# Patient Record
Sex: Female | Born: 1955 | Race: White | Hispanic: No | Marital: Married | Smoking: Never smoker
Health system: Southern US, Community
[De-identification: ages and names within clinical notes are randomized; demographics above are authoritative.]

## PROBLEM LIST (undated history)

## (undated) DIAGNOSIS — R011 Cardiac murmur, unspecified: Secondary | ICD-10-CM

## (undated) DIAGNOSIS — E785 Hyperlipidemia, unspecified: Secondary | ICD-10-CM

## (undated) DIAGNOSIS — T7840XA Allergy, unspecified, initial encounter: Secondary | ICD-10-CM

## (undated) DIAGNOSIS — M8580 Other specified disorders of bone density and structure, unspecified site: Secondary | ICD-10-CM

## (undated) DIAGNOSIS — T884XXA Failed or difficult intubation, initial encounter: Secondary | ICD-10-CM

## (undated) DIAGNOSIS — I10 Essential (primary) hypertension: Secondary | ICD-10-CM

## (undated) DIAGNOSIS — G709 Myoneural disorder, unspecified: Secondary | ICD-10-CM

## (undated) DIAGNOSIS — G4730 Sleep apnea, unspecified: Secondary | ICD-10-CM

## (undated) HISTORY — DX: Myoneural disorder, unspecified: G70.9

## (undated) HISTORY — DX: Hyperlipidemia, unspecified: E78.5

## (undated) HISTORY — DX: Essential (primary) hypertension: I10

## (undated) HISTORY — DX: Sleep apnea, unspecified: G47.30

## (undated) HISTORY — DX: Failed or difficult intubation, initial encounter: T88.4XXA

## (undated) HISTORY — DX: Allergy, unspecified, initial encounter: T78.40XA

## (undated) HISTORY — DX: Cardiac murmur, unspecified: R01.1

## (undated) HISTORY — DX: Other specified disorders of bone density and structure, unspecified site: M85.80

## (undated) HISTORY — PX: POLYPECTOMY: SHX149

## (undated) HISTORY — PX: VAGINAL HYSTERECTOMY: SUR661

## (undated) HISTORY — PX: COLONOSCOPY: SHX174

## (undated) HISTORY — PX: MYOMECTOMY: SHX85

---

## 1997-08-30 ENCOUNTER — Other Ambulatory Visit: Admission: RE | Admit: 1997-08-30 | Discharge: 1997-08-30 | Payer: Self-pay | Admitting: Obstetrics & Gynecology

## 1998-09-06 ENCOUNTER — Other Ambulatory Visit: Admission: RE | Admit: 1998-09-06 | Discharge: 1998-09-06 | Payer: Self-pay | Admitting: Obstetrics & Gynecology

## 1999-09-24 ENCOUNTER — Other Ambulatory Visit: Admission: RE | Admit: 1999-09-24 | Discharge: 1999-09-24 | Payer: Self-pay | Admitting: Obstetrics & Gynecology

## 2000-11-09 ENCOUNTER — Other Ambulatory Visit: Admission: RE | Admit: 2000-11-09 | Discharge: 2000-11-09 | Payer: Self-pay | Admitting: Obstetrics & Gynecology

## 2001-12-30 ENCOUNTER — Other Ambulatory Visit: Admission: RE | Admit: 2001-12-30 | Discharge: 2001-12-30 | Payer: Self-pay | Admitting: Obstetrics & Gynecology

## 2003-02-07 ENCOUNTER — Other Ambulatory Visit: Admission: RE | Admit: 2003-02-07 | Discharge: 2003-02-07 | Payer: Self-pay | Admitting: Obstetrics & Gynecology

## 2004-02-20 ENCOUNTER — Other Ambulatory Visit: Admission: RE | Admit: 2004-02-20 | Discharge: 2004-02-20 | Payer: Self-pay | Admitting: Obstetrics & Gynecology

## 2005-04-22 ENCOUNTER — Other Ambulatory Visit: Admission: RE | Admit: 2005-04-22 | Discharge: 2005-04-22 | Payer: Self-pay | Admitting: Obstetrics & Gynecology

## 2005-10-15 ENCOUNTER — Ambulatory Visit: Payer: Self-pay | Admitting: Internal Medicine

## 2005-10-28 ENCOUNTER — Encounter: Payer: Self-pay | Admitting: Internal Medicine

## 2005-10-28 ENCOUNTER — Ambulatory Visit: Payer: Self-pay | Admitting: Internal Medicine

## 2006-06-19 ENCOUNTER — Encounter: Admission: RE | Admit: 2006-06-19 | Discharge: 2006-06-19 | Payer: Self-pay | Admitting: Obstetrics & Gynecology

## 2006-11-04 ENCOUNTER — Ambulatory Visit (HOSPITAL_COMMUNITY): Admission: RE | Admit: 2006-11-04 | Discharge: 2006-11-05 | Payer: Self-pay | Admitting: Obstetrics & Gynecology

## 2006-11-04 ENCOUNTER — Encounter (INDEPENDENT_AMBULATORY_CARE_PROVIDER_SITE_OTHER): Payer: Self-pay | Admitting: Obstetrics & Gynecology

## 2009-08-27 ENCOUNTER — Encounter: Admission: RE | Admit: 2009-08-27 | Discharge: 2009-08-27 | Payer: Self-pay | Admitting: Obstetrics & Gynecology

## 2010-05-28 NOTE — Op Note (Signed)
NAMECORALYN, Tonya Pineda              ACCOUNT NO.:  [id number]   MEDICAL RECORD NO.:  [id number]          PATIENT TYPE:  OIB   LOCATION:  9318                          FACILITY:  [facility]   PHYSICIAN:  Freddy Finner, M.D.   DATE OF BIRTH:  1955/07/11   DATE OF PROCEDURE:  11/04/2006  DATE OF DISCHARGE:                               OPERATIVE REPORT   PREOPERATIVE DIAGNOSES:  1. Large uterine leiomyomata.  2. 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   POSTOPERATIVE DIAGNOSES:  1. Large uterine leiomyomata.  2. 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   __________  Laparoscopically-assisted vaginal hysterectomy and bilateral salpingo-  oophorectomy.   SURGEON:  Jennette Kettle.   ASSISTANTRana Snare.   ESTIMATED INTRAOPERATIVE BLOOD LOSS:  200 mL.   ANESTHESIA:  General endotracheal.   INTRAOPERATIVE COMPLICATIONS:  Extraordinarily difficult intubation  requiring 45 minutes with competent anesthesia management which was  initially accomplished.  There were no other intraoperative  complications.   Patient was admitted on the morning for surgery.  She was given an IV  bolus of Ancef.  Preoperatively, she was placed in TEDs hose.  She was  brought to the operating room, placed under adequate general  endotracheal anesthesia, placed in the dorsal lithotomy position using  the [place] stirrup system.  Betadine prep of abdomen, perineum, and  vagina was accomplished using Betadine scrub followed by Betadine  solution.  Foley catheter was used to evacuate the bladder with sterile  technique.  Hulka tenaculum was attached to the cervix without  difficulty.  Sterile drapes were applied.  Two small incisions were made  in the abdomen, one at the umbilicus and one just above the symphysis  through an old lower abdominal transverse scar.  Through the upper  incision, an 11-mm blade and disposable trocar were introduced while  elevating the abdominal wall manually.  Direct inspection  revealed  adequate placement with no evidence of injury on entry.  Pneumoperitoneum was allowed to accumulate with carbon dioxide gas.  A  second 5 mm trocar was placed through the lower incision under direct  visualization.  Through this spring-loaded grasping forceps was used.  Systematic examination of pelvic contents revealed the uterus to be  irregularly enlarged and approximately 10 to 12 weeks' size.  The tubes  and ovaries were normal and appropriate for age.  There was no apparent  abnormality in the upper abdomen.  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  Using the gyrus tripolar device through the  operating tunnel of the laparoscope, the right infundibulopelvic  ligament, right round ligament, upper broad ligament, each was  progressively sealed and divided.  The dissection was carried down to  the level just above the uterine arteries.  The left side was then  treated essentially identically.  Careful examination of the latter did  reveal that it was not dramatically advanced off the cervix.  Attention  was then turned vaginally.  A posterior wider vaginal retractor was  placed.  The hook tenaculum was removed and the cervix grasped with a  Jacobs tenaculum.  Colpotomy incision was made  by __________  the mucosa  posterior to the cervix and entering with Mayo scissors.  Cervix was  circumscribed with a scalpel.  The uterosacral  pedicles were then  sealed and divided using the LigaSure system.  Bladder was advanced off  the cervix.  Bladder pillars were taken, sealed, and divided with  LigaSure.  The anterior dissection was progressively continued and the  bladder further advanced off the uterine cervix.  Cardinal ligament  pedicles were taken, sealed, and divided with LigaSure.  Further  advancement was accomplished and the vessel pedicles were tucked  inferior.  Digital expiration revealed that the dissection was into the  myometrium but the peritoneum could not be  palpated above the uterus and  anteriorly.  After adequately controlling the blood supply, the uterus  was reduced in volume with a coring procedure.  The remaining corpus of  the uterus was delivered through the introitus and remaining peritoneum  and scoring anteriorly was sealed and divided using LigaSure.  The  patient was given indigo carmine IV approximately 10 minutes before  concluding this portion of the procedure and no blue dye was noted to  leak from the dissected field.  The uterus cycles were then anchored to  the mucosa with a mattress suture of 0 Monocryl on each side.  The  posterior peritoneum was closed and the uterus cycle was plicated with  an interrupted 0 Monocryl suture.  Cuff was placed vertically with  figure-of-eight 0 Monocryl.  Foley catheter was placed and immediate  spillage of blue urine was noted.  Hemostasis at this level was  complete.  Reinspection abdominally was then carried out with the  laparoscope and then Nazat irrigating system.  Hemostasis was also  confirmed abdominally.  Photographs were made before and after the  hysterectomy and are retained in the office record.  The procedure was  terminated after aspirating all the irrigating solution from the  abdomen.  The instruments were removed.  The gas was allowed to escape  from the abdomen.  The skin incisions were anesthetized with 0.25% plain  Marcaine and closed with interrupted subcuticular sutures of 3-0 Dexon.  Steri-Strips were also applied to the lower incision.  A sterile  dressing was applied to the umbilicus.  The patient was awakened and  taken to the recovery in satisfactory condition.      Freddy Finner, M.D.  Electronically Signed     WRN/MEDQ  D:  11/04/2006  T:  11/05/2006  Job:  [id number]

## 2010-05-28 NOTE — H&P (Signed)
NAMELYNDEL, DANCEL              ACCOUNT NO.:  [id number]   MEDICAL RECORD NO.:  [id number]          PATIENT TYPE:  AMB   LOCATION:  SDC                           [facility]ILITY:  [facility]   PHYSICIAN:  Freddy Finner, M.D.   DATE OF BIRTH:  27-Sep-1955   DATE OF ADMISSION:  11/04/2006  DATE OF DISCHARGE:                              HISTORY & PHYSICAL   ADMITTING DIAGNOSIS:  Uterine leiomyomata.  Suspected recent  degeneration of myoma, creating significant pelvic cramping pain.  Progressive enlargement of myomas over time has been noted and followed  in the office.  The patient did have a myomectomy in 1986.  She had  given birth to one child vaginally before this myomectomy and had a  second child by cesarean section in 1988.  Over the interval since that  time, she has been managed with oral contraceptives for most of the  time.  She did present in September of this year at which time the  uterus was thought to be approximately [redacted] weeks gestational size.  Pelvic ultrasound revealed numerous leiomyomata, including one impinging  on the endometrial cavity.  This is creating chronic pelvic pain and  cramping.  She has requested definitive surgery at this time, given her  age of 34, and the large fibroids and pain associated with this, and she  is now admitted for laparoscopically-assisted vaginal hysterectomy,  bilateral salpingo-oophorectomy.  The patient has been counseled on the  possibility of a total abdominal hysterectomy, bilateral salpingo-  oophorectomy, given her previous history of myomectomy and the large  size of the uterus at the present time.  She has been counseled on the  potential risks of either procedure, including hemorrhage, infection,  injury to other organs.  She is now admitted and prepared to proceed  with surgery.   Her current review of systems is otherwise negative.  There are no  specific cardiopulmonary, GI or other GU complaints.   She did have an  appendectomy at the time of her myomectomy.  She has had  no other known surgical procedures.   She has no known allergies to medications.   She does have hypertension, for which she takes 25 mg of  hydrochlorothiazide a day and 10 mg of lisinopril per day and Diltiazem  360 mg a day.  She takes Xalatan eye drops.  She takes Nasonex for  seasonal allergies.   She has never had to have a blood transfusion.   She is not a cigarette-smoker.  She does not use alcohol.   FAMILY HISTORY:  Noncontributory.   PHYSICAL EXAMINATION:  HEENT:  Grossly within normal limits.  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  She is aware  of this from previous surgery.  Her blood pressure was 140/70 in the office two days prior to her  admission at her preoperative examination.  CHEST:  Clear to auscultation.  HEART:  Normal sinus rhythm.  There is a grade 2/6 early systolic  murmur, heard best at the left sternal border and the second intercostal  space.  There are no other  audible murmurs, no rubs or gallops.  BREAST EXAM:  Considered to be normal, no palpable masses, no skin  change or nipple discharge.  Recent mammogram was considered to be  normal.  ABDOMEN:  Soft and nontender.  The uterus is palpable above the  symphysis.  There is no other appreciable hepatosplenomegaly, there is  no CVA tenderness.  PELVIC EXAM:  External genitalia, vagina and cervix are normal to  inspection.  Bimanual reveals uterus to be anterior in position, [redacted]  weeks gestational size.  There are no palpable adnexal masses.  RECTOVAGINAL EXAM:  Confirms these findings.  There are no palpable  rectal abnormalities.  EXTREMITIES:  Without cyanosis, clubbing or edema.   ASSESSMENT:  Large uterine leiomyomata, now creating chronic pelvic  pain.   PLAN:  Total hysterectomy, bilateral salpingo-oophorectomy by either  laparoscopic or abdominal incision.      Freddy Finner, M.D.   Electronically Signed     WRN/MEDQ  D:  11/03/2006  T:  11/04/2006  Job:  [id number]

## 2010-05-31 NOTE — Discharge Summary (Signed)
Tonya Pineda, Tonya Pineda              ACCOUNT NO.:  [id number]   MEDICAL RECORD NO.:  [id number]          PATIENT TYPE:  OIB   LOCATION:  9318                          [facility]   PHYSICIAN:  Freddy Finner, M.D.   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  Numerous leiomyomata identified histologically and benign.  No  confirmation of degeneration of fibroid.   OPERATIVE PROCEDURE:  Laparoscopic-assisted vaginal hysterectomy,  bilateral salpingo-oophorectomy.   INTRAOPERATIVE AND POSTOPERATIVE COMPLICATIONS:  None.   DISPOSITION:  The patient was in satisfactory improved condition on the  day following her surgery.  She was ambulating without difficulty,  having adequate bowel and bladder function.  She remained afebrile  throughout her hospital stay.  She was discharged home with Percocet  5/325 to be taken as needed for postoperative pain.  She is to resume  all of her admission medications.   The details of the present illness, past history, family history, review  of systems and physical exam are recorded in the admission note.  Physical findings on admission were remarkable for enlargement of the  uterus.  Her clinical history was significant with chronic pain.   Laboratory data during this admission includes hemoglobin of 14.6 on  admission with a normal CBC.  Normal prothrombin time and PTT.  Chem-6  showed potassium a little low at 3.4.  Postoperative hemoglobin was 11.4   HOSPITAL COURSE:  The patient was admitted on the morning of surgery.  She was treated perioperatively with IV antibiotics and antiembolic  compression hose.  The above-described procedure was accomplished  without difficulty or intraoperative complications.  By the  morning of  the first postoperative day, her condition was considered to be good.  She remained afebrile throughout her hospital stay.  The patient was  discharged home with disposition as noted above.      Freddy Finner, M.D.  Electronically Signed     WRN/MEDQ  D:  12/09/2006  T:  12/09/2006  Job:  [id number]

## 2010-10-23 LAB — APTT: aPTT: 24

## 2010-10-23 LAB — BASIC METABOLIC PANEL
BUN: 7
Calcium: 9.8
Chloride: 95 — ABNORMAL LOW
Sodium: 135

## 2010-10-23 LAB — CBC
Hemoglobin: 11.4 — ABNORMAL LOW
MCHC: 35
MCV: 93.7
MCV: 94.8
RBC: 3.47 — ABNORMAL LOW
RDW: 12.4

## 2010-11-25 ENCOUNTER — Encounter: Payer: Self-pay | Admitting: Internal Medicine

## 2010-12-24 ENCOUNTER — Encounter: Payer: Self-pay | Admitting: Internal Medicine

## 2011-01-03 ENCOUNTER — Other Ambulatory Visit: Payer: Self-pay | Admitting: Internal Medicine

## 2011-02-04 ENCOUNTER — Ambulatory Visit (AMBULATORY_SURGERY_CENTER): Payer: 59

## 2011-02-04 VITALS — Ht 63.5 in | Wt 149.4 lb

## 2011-02-04 DIAGNOSIS — Z86010 Personal history of colonic polyps: Secondary | ICD-10-CM

## 2011-02-04 DIAGNOSIS — Z1211 Encounter for screening for malignant neoplasm of colon: Secondary | ICD-10-CM

## 2011-02-04 MED ORDER — PEG-KCL-NACL-NASULF-NA ASC-C 100 G PO SOLR: 1.0000 | Freq: Once | ORAL | Status: AC

## 2011-02-05 ENCOUNTER — Encounter: Payer: Self-pay | Admitting: Internal Medicine

## 2011-02-14 ENCOUNTER — Ambulatory Visit (AMBULATORY_SURGERY_CENTER): Payer: 59 | Admitting: Internal Medicine

## 2011-02-14 ENCOUNTER — Encounter: Payer: Self-pay | Admitting: Internal Medicine

## 2011-02-14 VITALS — BP 144/77 | HR 87 | Temp 97.4°F | Resp 20 | Ht 63.5 in | Wt 149.0 lb

## 2011-02-14 DIAGNOSIS — Z1211 Encounter for screening for malignant neoplasm of colon: Secondary | ICD-10-CM

## 2011-02-14 DIAGNOSIS — D128 Benign neoplasm of rectum: Secondary | ICD-10-CM

## 2011-02-14 DIAGNOSIS — D126 Benign neoplasm of colon, unspecified: Secondary | ICD-10-CM

## 2011-02-14 DIAGNOSIS — D129 Benign neoplasm of anus and anal canal: Secondary | ICD-10-CM

## 2011-02-14 DIAGNOSIS — Z86010 Personal history of colonic polyps: Secondary | ICD-10-CM

## 2011-02-14 MED ORDER — SODIUM CHLORIDE 0.9 % IV SOLN: 500.0000 mL | INTRAVENOUS | Status: DC

## 2011-02-14 NOTE — Op Note (Signed)
[facility] [address] [place], [place]  [address]  COLONOSCOPY PROCEDURE REPORT  PATIENT:  Tonya Pineda, Tonya Pineda  MR#:  [id number] BIRTHDATE:  29-Oct-1955, 55 yrs. old  GENDER:  female ENDOSCOPIST:  Hedwig Morton. Juanda Chance, MD REF. 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 Digital rectal exam was performed and revealed no rectal masses. The LB 180AL [id number] endoscope was introduced through the anus and advanced to the cecum, which was identified by both the appendix and ileocecal valve.  The quality of the prep was excellent, using MoviPrep.  The instrument was then slowly withdrawn as the colon was fully examined. <<PROCEDUREIMAGES>>  FINDINGS:  A diminutive polyp was found. 2 mm polyp at 20 cm The polyp was removed using cold biopsy forceps.  Scattered diverticula were found (see image8 and image7). scattered shallow diverticuli in the right colon  This was otherwise a normal examination of the colon (see image9, image6, image3, image5, and image2).   Retroflexed views in the rectum revealed no abnormalities.    The scope was then withdrawn from the patient and the procedure completed.  COMPLICATIONS:  None ENDOSCOPIC IMPRESSION: 1) Diminutive polyp 2) Diverticula, scattered 3) Otherwise normal examination RECOMMENDATIONS: 1) Await pathology results 2) High fiber diet.  REPEAT EXAM:  In 10 year(s) for.  ______________________________ Hedwig Morton. Juanda Chance, MD  CC:  n. eSIGNED:   Hedwig Morton. Tonya Pineda at 02/14/2011 08:33 AM  Tonya Pineda,  [id number]

## 2011-02-14 NOTE — Progress Notes (Signed)
Patient did not have preoperative order for IV antibiotic SSI prophylaxis. (G8918)  Patient did not experience any of the following events: a burn prior to discharge; a fall within the facility; wrong site/side/patient/procedure/implant event; or a hospital transfer or hospital admission upon discharge from the facility. (G8907)  

## 2011-02-14 NOTE — Patient Instructions (Signed)
See discharge instructions.   Sleep study pamphlet given.

## 2011-02-17 ENCOUNTER — Telehealth: Payer: Self-pay | Admitting: *Deleted

## 2011-02-17 NOTE — Telephone Encounter (Signed)
  Follow up Call-  Call back number 02/14/2011  Post procedure Call Back phone  # [phone]  Permission to leave phone message Yes     Patient questions:  Left message for patient to call us if necessary.

## 2011-02-18 ENCOUNTER — Encounter: Payer: Self-pay | Admitting: Internal Medicine

## 2013-08-22 ENCOUNTER — Encounter: Payer: Self-pay | Admitting: Internal Medicine

## 2013-09-09 ENCOUNTER — Other Ambulatory Visit: Payer: Self-pay | Admitting: Obstetrics & Gynecology

## 2013-09-09 DIAGNOSIS — R928 Other abnormal and inconclusive findings on diagnostic imaging of breast: Secondary | ICD-10-CM

## 2013-09-15 ENCOUNTER — Encounter (INDEPENDENT_AMBULATORY_CARE_PROVIDER_SITE_OTHER): Payer: Self-pay

## 2013-09-15 ENCOUNTER — Ambulatory Visit
Admission: RE | Admit: 2013-09-15 | Discharge: 2013-09-15 | Disposition: A | Discharge status: Home / Self Care | Payer: 59 | Source: Ambulatory Visit | Attending: Obstetrics & Gynecology | Admitting: Obstetrics & Gynecology

## 2013-09-15 DIAGNOSIS — R928 Other abnormal and inconclusive findings on diagnostic imaging of breast: Secondary | ICD-10-CM

## 2014-09-11 ENCOUNTER — Other Ambulatory Visit: Payer: Self-pay | Admitting: Obstetrics & Gynecology

## 2014-09-12 LAB — CYTOLOGY - PAP

## 2016-09-24 ENCOUNTER — Other Ambulatory Visit: Payer: Self-pay | Admitting: Obstetrics & Gynecology

## 2016-09-24 DIAGNOSIS — R928 Other abnormal and inconclusive findings on diagnostic imaging of breast: Secondary | ICD-10-CM

## 2016-09-29 ENCOUNTER — Ambulatory Visit
Admission: RE | Admit: 2016-09-29 | Discharge: 2016-09-29 | Disposition: A | Discharge status: Home / Self Care | Payer: 59 | Source: Ambulatory Visit | Attending: Obstetrics & Gynecology | Admitting: Obstetrics & Gynecology

## 2016-09-29 ENCOUNTER — Ambulatory Visit: Payer: Self-pay

## 2016-09-29 DIAGNOSIS — R928 Other abnormal and inconclusive findings on diagnostic imaging of breast: Secondary | ICD-10-CM

## 2018-02-27 IMAGING — MG 2D DIGITAL DIAGNOSTIC UNILATERAL RIGHT MAMMOGRAM WITH CAD AND AD
6 series · 6 of 14 positions shown · non-contrast
Comparison: Previous exam(s).

CLINICAL DATA: 61-year-old female recalled from screening mammogram
dated 09/22/2016 for possible right breast distortion.

EXAM:
2D DIGITAL DIAGNOSTIC UNILATERAL RIGHT MAMMOGRAM WITH CAD AND
ADJUNCT TOMO

[R MLO]
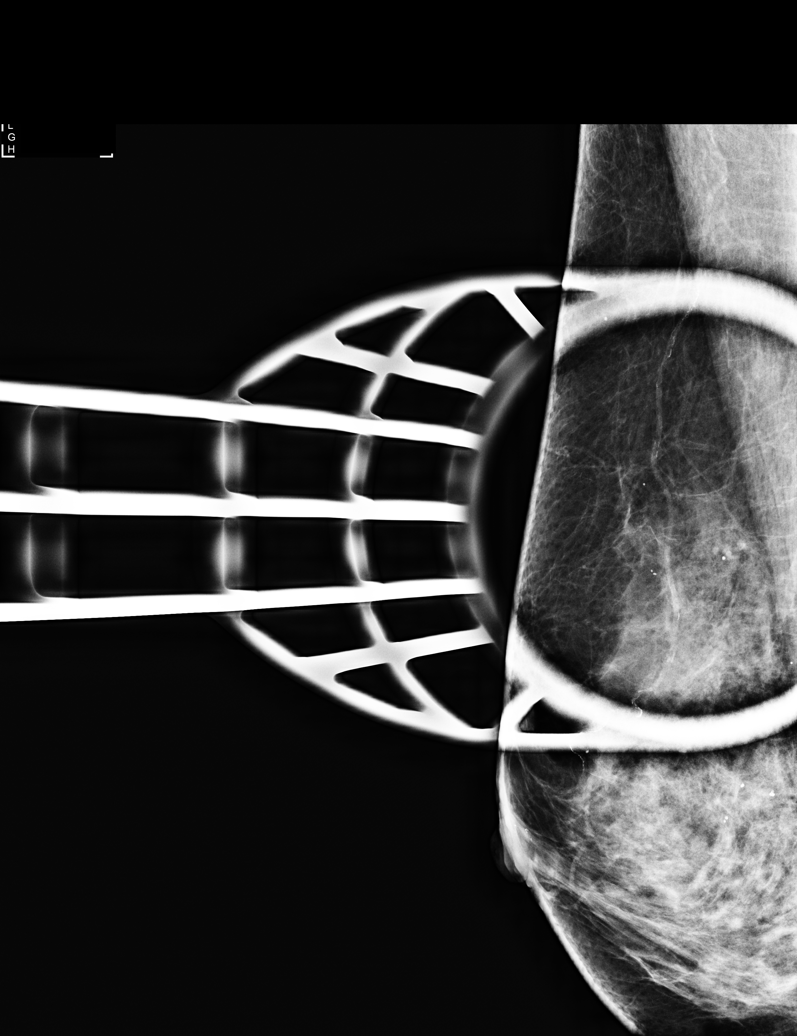

[R ML]
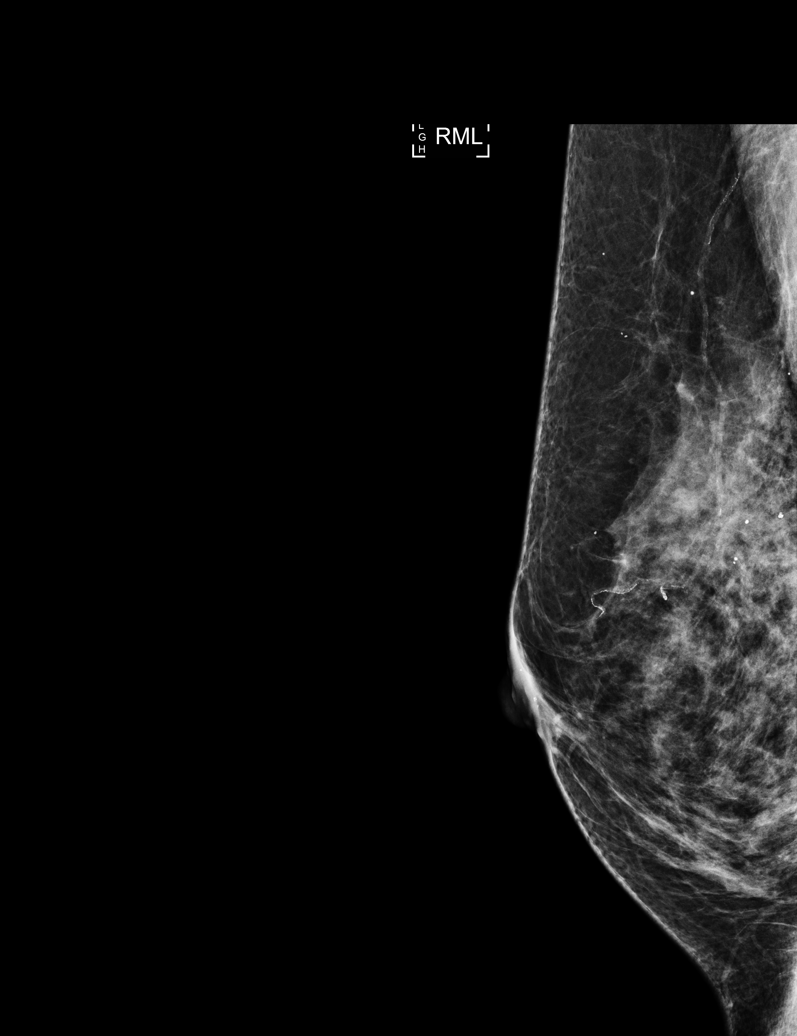

[R MLO synth-2D]
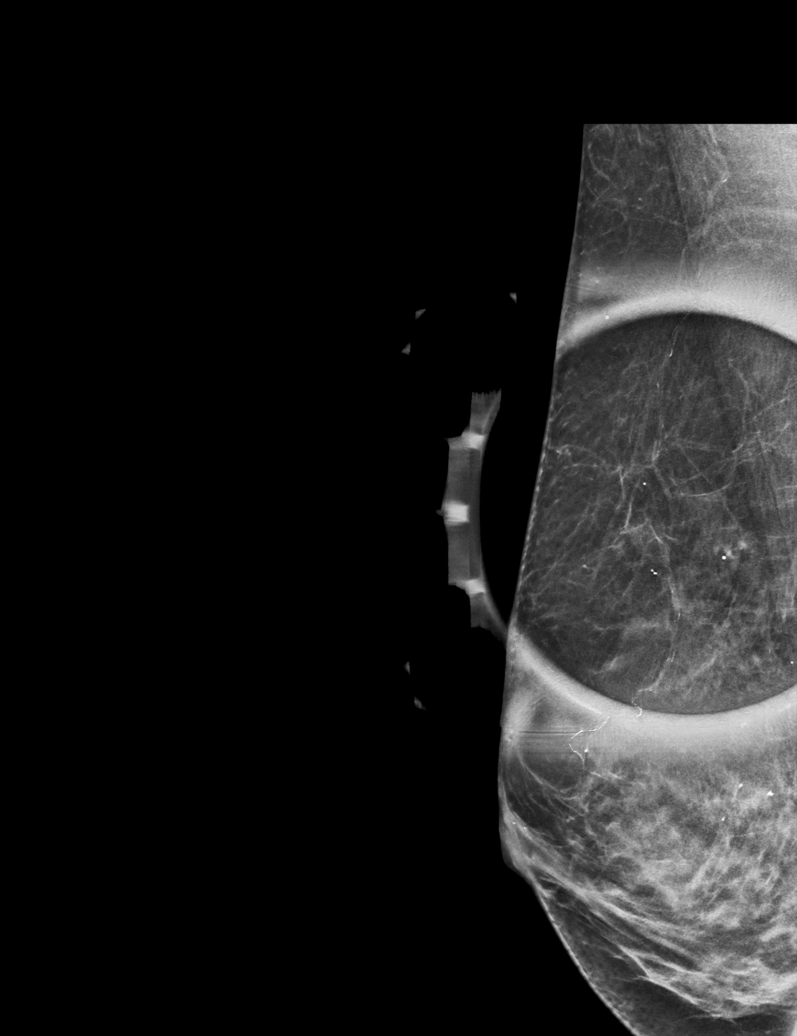

[R ML synth-2D]
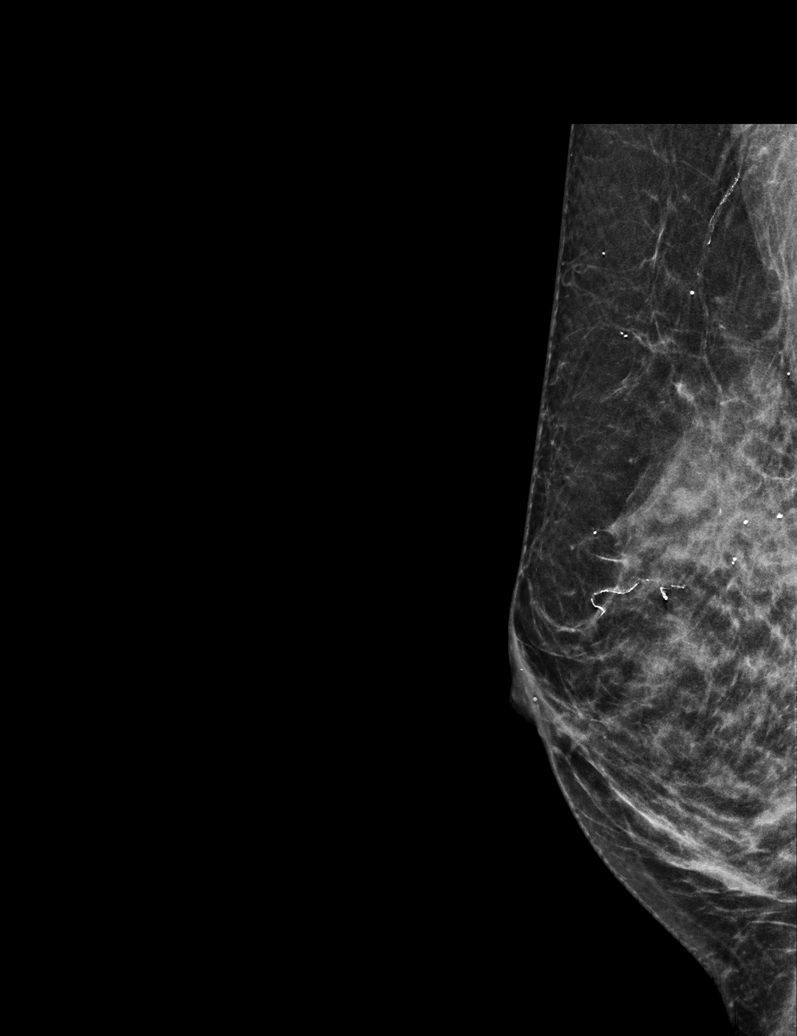

[R MLO tomo · tomo slice 26/51.0]
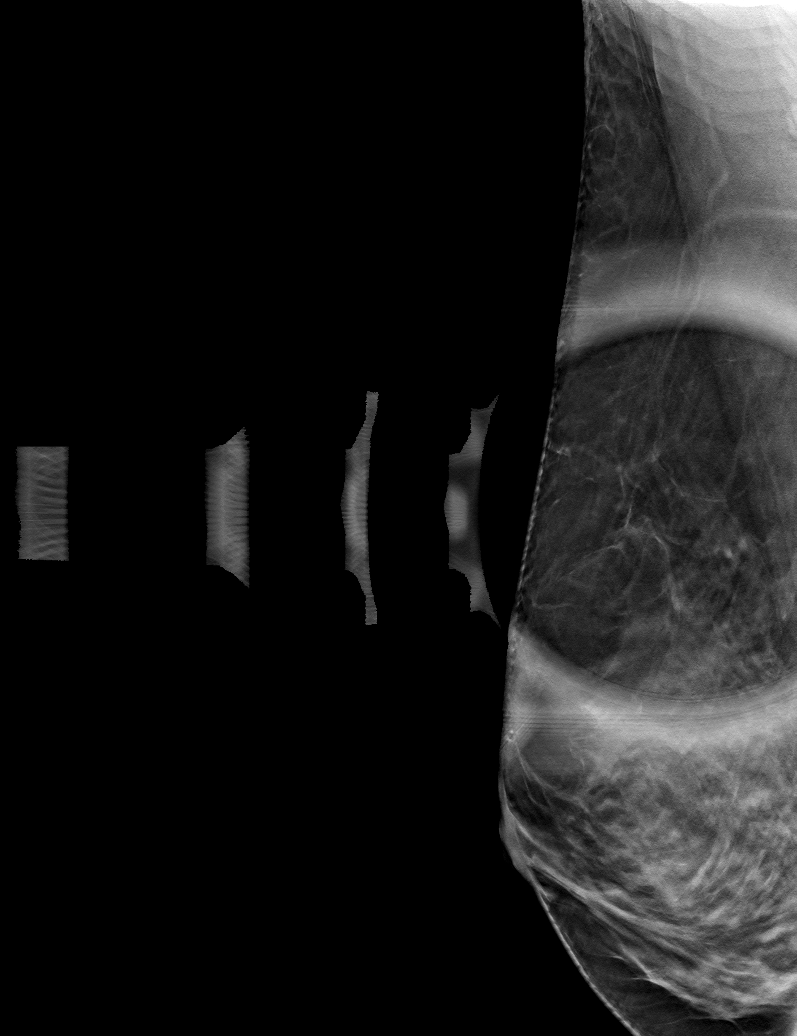

[R ML tomo · tomo slice 25/50.0]
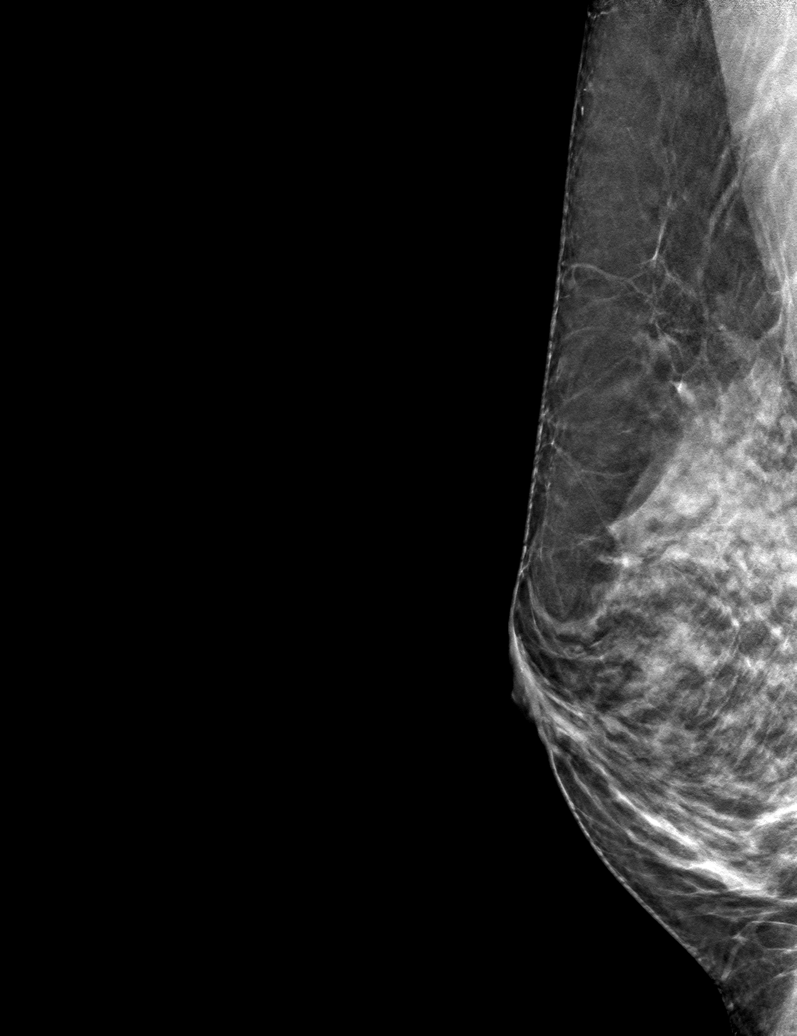

[6 of 14 positions shown; findings below may reference images not displayed]

ACR Breast Density Category c: The breast tissue is heterogeneously
dense, which may obscure small masses.
FINDINGS: The previously described questionable distortion in the superior
right breast at middle depth on the MLO projection only resolves
into well dispersed fibroglandular tissue on today's additional
views. No suspicious findings are identified.

Mammographic images were processed with CAD.
IMPRESSION: No mammographic evidence of malignancy.

RECOMMENDATION:
Screening mammogram in one year.(Code:[id number])

I have discussed the findings and recommendations with the patient.
Results were also provided in writing at the conclusion of the
visit. If applicable, a reminder letter will be sent to the patient
regarding the next appointment.

BI-RADS CATEGORY  1: Negative.

## 2021-04-16 ENCOUNTER — Encounter: Payer: Self-pay | Admitting: Gastroenterology

## 2021-04-25 ENCOUNTER — Ambulatory Visit (AMBULATORY_SURGERY_CENTER): Payer: 59 | Admitting: *Deleted

## 2021-04-25 ENCOUNTER — Encounter: Payer: Self-pay | Admitting: *Deleted

## 2021-04-25 ENCOUNTER — Telehealth: Payer: Self-pay | Admitting: *Deleted

## 2021-04-25 VITALS — Ht 65.0 in | Wt 150.0 lb

## 2021-04-25 DIAGNOSIS — Z9189 Other specified personal risk factors, not elsewhere classified: Secondary | ICD-10-CM | POA: Insufficient documentation

## 2021-04-25 DIAGNOSIS — Z1211 Encounter for screening for malignant neoplasm of colon: Secondary | ICD-10-CM

## 2021-04-25 NOTE — Telephone Encounter (Signed)
Dr Bryan Lemma and Jenny Reichmann, ? ?I completed a PV on this pt today - she told me at her last 2 surgeries she had a narrow throat and has to intubate her while awake- when she relaxes her opening closes smaller per pt - states took them 30 minutes to intubate with surgery -- due to this, does she need an OV or Direct at [facility]- I cannot fins any anesthesia notes but she was very knowledgeable about her issues in the past with intubation  ? ?Please advise- Thanks Lelan Pons PV  ?

## 2021-04-25 NOTE — Telephone Encounter (Signed)
Called pt and notified her of direct [facility] colonoscopy - she is aware Office nurse will call her with date and time, I completed her PV but did not do instructions for her [place] as I dont have a [place] date   ?

## 2021-04-25 NOTE — Telephone Encounter (Signed)
Agree, plan for direct access colonoscopy at [facility] unit. ?

## 2021-04-25 NOTE — Telephone Encounter (Signed)
Tonya Pineda,  ? ?Can you please schedule Tonya Pineda at [facility] for a recall screening colon per below due to difficult intubation  ? ?Thanks,Marie PV  ?

## 2021-04-25 NOTE — Progress Notes (Signed)
No egg or soy allergy known to patient  ? ?- she states she has a narrow throat and has to intubate her while awake- when she relaxes her opening closes smaller per pt - states took them 30 minutes to intubate with surgery  TE VC and J Nulty CRNA to advise ? Ov vs direct at [facility]- pt aware  ? ?No FH of Malignant Hyperthermia ?Pt is not on diet pills ?Pt is not on  home 02  ?Pt is not on blood thinners  ?Pt denies issues with constipation daily but occ issues  ?No A fib or A flutter ? ? NO PA's for preps discussed with pt In PV today  ?Discussed with pt there will be an out-of-pocket cost for prep and that varies from $0 to 70 +  dollars - pt verbalized understanding  ? ?Due to the COVID-19 pandemic we are asking patients to follow certain guidelines in PV and the [place]   ?Pt aware of COVID protocols and [facility] guidelines  ? ?PV completed over the phone. Pt verified name, DOB, address and insurance during PV today.  ?Pt mailed instruction packet with copy of consent form to read and not return, and instructions.  ?Pt encouraged to call with questions or issues.  ?If pt has My chart, procedure instructions sent via My Chart  ? ?

## 2021-04-29 ENCOUNTER — Other Ambulatory Visit: Payer: Self-pay

## 2021-04-29 DIAGNOSIS — Z86010 Personal history of colonic polyps: Secondary | ICD-10-CM

## 2021-04-29 DIAGNOSIS — Z1211 Encounter for screening for malignant neoplasm of colon: Secondary | ICD-10-CM

## 2021-04-29 NOTE — Telephone Encounter (Signed)
Spoke with pt and she stated she could do procedure on 06/26/21. Pre-visit scheduled for 05/27/21 at 11 am. Pt verbalized understanding and had no other concerns at end of call.  ?

## 2021-04-29 NOTE — Telephone Encounter (Signed)
Procedure scheduled for 06/26/21 at 9:30 am with Dr. Bryan Lemma at [facility]. Left message for pt to call back.  ?

## 2021-05-14 ENCOUNTER — Telehealth: Payer: Self-pay | Admitting: *Deleted

## 2021-05-14 DIAGNOSIS — Z1211 Encounter for screening for malignant neoplasm of colon: Secondary | ICD-10-CM

## 2021-05-14 MED ORDER — NA SULFATE-K SULFATE-MG SULF 17.5-3.13-1.6 GM/177ML PO SOLN: 1.0000 | ORAL | 0 refills | Status: AC

## 2021-05-14 NOTE — Telephone Encounter (Signed)
Spoke with patient. She explained that she did not need another PV. New hospital prep instructions sent to Mychart and mailed to patient and suprep Rx sent to pharmacy-pt will use [facility] for the suprep rx. Pt encouraged to call us back with any questions or if any medical hx changes occur.  ?

## 2021-06-06 ENCOUNTER — Encounter: Payer: 59 | Admitting: Gastroenterology

## 2021-06-17 ENCOUNTER — Encounter (HOSPITAL_COMMUNITY): Payer: Self-pay | Admitting: Gastroenterology

## 2021-06-17 NOTE — Progress Notes (Signed)
Attempted to obtain medical history via telephone, unable to reach at this time. HIPAA compliant voicemail message left requesting return call to pre surgical testing department. 

## 2021-06-20 ENCOUNTER — Encounter: Payer: Self-pay | Admitting: Gastroenterology

## 2021-06-21 ENCOUNTER — Encounter: Payer: Self-pay | Admitting: Gastroenterology

## 2021-06-25 NOTE — Anesthesia Preprocedure Evaluation (Signed)
Anesthesia Evaluation  Patient identified by MRN, date of birth, ID band Patient awake    Reviewed: Allergy & Precautions, NPO status , Patient's Chart, lab work & pertinent test results  History of Anesthesia Complications (+) DIFFICULT AIRWAY and history of anesthetic complications  Airway Mallampati: III  TM Distance: <3 FB Neck ROM: Full  Mouth opening: Limited Mouth Opening  Dental no notable dental hx. (+) Teeth Intact, Dental Advisory Given   Pulmonary    Pulmonary exam normal breath sounds clear to auscultation       Cardiovascular hypertension, Normal cardiovascular exam Rhythm:Regular Rate:Normal     Neuro/Psych    GI/Hepatic   Endo/Other    Renal/GU      Musculoskeletal   Abdominal   Peds  Hematology   Anesthesia Other Findings   Reproductive/Obstetrics                            Anesthesia Physical Anesthesia Plan  ASA: 2  Anesthesia Plan: MAC   Post-op Pain Management:    Induction:   PONV Risk Score and Plan: Treatment may vary due to age or medical condition  Airway Management Planned: Natural Airway and Nasal Cannula  Additional Equipment: None  Intra-op Plan:   Post-operative Plan:   Informed Consent: I have reviewed the patients History and Physical, chart, labs and discussed the procedure including the risks, benefits and alternatives for the proposed anesthesia with the patient or authorized representative who has indicated his/her understanding and acceptance.     Dental advisory given  Plan Discussed with: CRNA  Anesthesia Plan Comments: (Hx of colon Polyps)       Anesthesia Quick Evaluation

## 2021-06-26 ENCOUNTER — Encounter (HOSPITAL_COMMUNITY)
Admission: RE | Disposition: A | Discharge status: Home / Self Care | Payer: Self-pay | Source: Home / Self Care | Attending: Gastroenterology

## 2021-06-26 ENCOUNTER — Ambulatory Visit (HOSPITAL_COMMUNITY): Payer: 59 | Admitting: Anesthesiology

## 2021-06-26 ENCOUNTER — Encounter (HOSPITAL_COMMUNITY): Payer: Self-pay | Admitting: Gastroenterology

## 2021-06-26 ENCOUNTER — Ambulatory Visit (HOSPITAL_COMMUNITY)
Admission: RE | Admit: 2021-06-26 | Discharge: 2021-06-26 | Disposition: A | Discharge status: Home / Self Care | Payer: 59 | Attending: Gastroenterology | Admitting: Gastroenterology

## 2021-06-26 ENCOUNTER — Other Ambulatory Visit: Payer: Self-pay

## 2021-06-26 ENCOUNTER — Ambulatory Visit (HOSPITAL_BASED_OUTPATIENT_CLINIC_OR_DEPARTMENT_OTHER): Payer: 59 | Admitting: Anesthesiology

## 2021-06-26 DIAGNOSIS — Z86010 Personal history of colonic polyps: Secondary | ICD-10-CM

## 2021-06-26 DIAGNOSIS — Z1211 Encounter for screening for malignant neoplasm of colon: Secondary | ICD-10-CM

## 2021-06-26 DIAGNOSIS — K5730 Diverticulosis of large intestine without perforation or abscess without bleeding: Secondary | ICD-10-CM

## 2021-06-26 DIAGNOSIS — K621 Rectal polyp: Secondary | ICD-10-CM | POA: Diagnosis not present

## 2021-06-26 DIAGNOSIS — Z8719 Personal history of other diseases of the digestive system: Secondary | ICD-10-CM | POA: Diagnosis not present

## 2021-06-26 DIAGNOSIS — Q438 Other specified congenital malformations of intestine: Secondary | ICD-10-CM | POA: Diagnosis not present

## 2021-06-26 DIAGNOSIS — I10 Essential (primary) hypertension: Secondary | ICD-10-CM | POA: Diagnosis not present

## 2021-06-26 DIAGNOSIS — K641 Second degree hemorrhoids: Secondary | ICD-10-CM | POA: Diagnosis not present

## 2021-06-26 DIAGNOSIS — Z800 Family history of malignant neoplasm of digestive organs: Secondary | ICD-10-CM | POA: Insufficient documentation

## 2021-06-26 HISTORY — PX: COLONOSCOPY WITH PROPOFOL: SHX5780

## 2021-06-26 HISTORY — PX: POLYPECTOMY: SHX5525

## 2021-06-26 SURGERY — COLONOSCOPY WITH PROPOFOL
Anesthesia: Monitor Anesthesia Care

## 2021-06-26 MED ORDER — PROPOFOL 10 MG/ML IV BOLUS
INTRAVENOUS | Status: DC | PRN
  Administered 2021-06-26: 30 mg via INTRAVENOUS
  Administered 2021-06-26: 10 mg via INTRAVENOUS
  Administered 2021-06-26 (×3): 20 mg via INTRAVENOUS

## 2021-06-26 MED ORDER — LIDOCAINE 2% (20 MG/ML) 5 ML SYRINGE
INTRAMUSCULAR | Status: DC | PRN
  Administered 2021-06-26: 100 mg via INTRAVENOUS

## 2021-06-26 MED ORDER — PROPOFOL 1000 MG/100ML IV EMUL
INTRAVENOUS | Status: AC
  Filled 2021-06-26: qty 100

## 2021-06-26 MED ORDER — LACTATED RINGERS IV SOLN: INTRAVENOUS | Status: DC

## 2021-06-26 MED ORDER — PROPOFOL 500 MG/50ML IV EMUL
INTRAVENOUS | Status: DC | PRN
  Administered 2021-06-26: 100 ug/kg/min via INTRAVENOUS

## 2021-06-26 MED ORDER — SODIUM CHLORIDE 0.9 % IV SOLN: INTRAVENOUS | Status: DC

## 2021-06-26 SURGICAL SUPPLY — 22 items

## 2021-06-26 NOTE — H&P (Addendum)
GASTROENTEROLOGY PROCEDURE H&P NOTE   Primary Care Physician: Walker Shadow, FNP    Reason for Procedure:  Colon Cancer screening  Plan:    Colonoscopy  Due to history of difficult airway with difficult intubation, procedure scheduled at [facility] Endoscopy unit.  The nature of the procedure, as well as the risks, benefits, and alternatives were carefully and thoroughly reviewed with the patient. Ample time for discussion and questions allowed. The patient understood, was satisfied, and agreed to proceed.     HPI: Tonya Pineda is a 66 y.o. female who presents for colonoscopy for ongoing Colon Cancer screening.  No active GI symptoms.  Does have intermittently symptomatic hemorrhoids (scant BRBPR).  Family history notable for paternal and maternal grandfathers both with colon cancer.  No first-degree relatives with colon cancer.  Endoscopic History: - Colonoscopy (10/04/1998): Normal - Colonoscopy (10/28/2005): 8 mm rectal tubular adenoma - Colonoscopy (02/14/2011): 2 mm rectal polyp (path: Benign), Right-sided diverticulosis  Past Medical History:  Diagnosis Date   Allergy    seasonal   Difficult airway for intubation    per pt x 2   Glaucoma    Heart murmur    benign   Hyperlipidemia    Hypertension    Neuromuscular disorder (HCC)    sciatica issues occ - exercises relieve the pain   Osteopenia    Sleep apnea    wears cpap , no 02    Past Surgical History:  Procedure Laterality Date   CESAREAN SECTION     x1   COLONOSCOPY     2007, 2000 no polyps , 2013 normal with 10 yr recall- [facility]   MYOMECTOMY     fibroid surgery   POLYPECTOMY     TA 2007   VAGINAL HYSTERECTOMY      Prior to Admission medications   Medication Sig Start Date End Date Taking? Authorizing Provider  brimonidine-timolol (COMBIGAN) 0.2-0.5 % ophthalmic solution Place 1 drop into both eyes 2 (two) times daily.   Yes [provider]  Calcium Carbonate-Vitamin D  600-400 MG-UNIT tablet Take 1 tablet by mouth 2 (two) times daily. Citracal with D3   Yes [provider]  cholecalciferol (VITAMIN D3) 25 MCG (1000 UNIT) tablet Take 3,000 Units by mouth daily.   Yes [provider]  diltiazem (TIAZAC) 360 MG 24 hr capsule Take 360 mg by mouth at bedtime.   Yes [provider]  estradiol (ESTRACE) 2 MG tablet Take 2 mg by mouth at bedtime. 04/11/21  Yes [provider]  hydrochlorothiazide (HYDRODIURIL) 25 MG tablet Take 25 mg by mouth daily.   Yes [provider]  latanoprost (XALATAN) 0.005 % ophthalmic solution Place 1 drop into both eyes at bedtime.   Yes [provider]  lisinopril (PRINIVIL,ZESTRIL) 10 MG tablet Take 10 mg by mouth daily.   Yes [provider]  Multiple Vitamins-Minerals (CENTRUM MULTIGUMMIES) CHEW Chew 2 each by mouth daily.   Yes [provider]  Na Sulfate-K Sulfate-Mg Sulf 17.5-3.13-1.6 GM/177ML SOLN Take 1 kit by mouth as directed. May use generic SUPREP;NO prior authorizations will be done.Please use Singlecare or GOOD-RX coupon. 05/14/21   Ayleah Hofmeister, [place], DO    No current facility-administered medications for this encounter.    Allergies as of 04/29/2021   (No Known Allergies)    Family History  Problem Relation Age of Onset   Colon polyps Father    Colon cancer Maternal Grandfather    Colon cancer Paternal Grandmother  Esophageal cancer Neg Hx    Stomach cancer Neg Hx    Rectal cancer Neg Hx     Social History   Socioeconomic History   Marital status: Married    Spouse name: Not on file   Number of children: Not on file   Years of education: Not on file   Highest education level: Not on file  Occupational History   Not on file  Tobacco Use   Smoking status: Never   Smokeless tobacco: Never  Substance and Sexual Activity   Alcohol use: Yes    Alcohol/week: 2.0 standard drinks of alcohol    Types: 2 drink(s) per week    Comment:  occasionally   Drug use: No   Sexual activity: Not on file  Other Topics Concern   Not on file  Social History Narrative   Not on file   Social Determinants of Health   Financial Resource Strain: Not on file  Food Insecurity: Not on file  Transportation Needs: Not on file  Physical Activity: Not on file  Stress: Not on file  Social Connections: Not on file  Intimate Partner Violence: Not on file    Physical Exam: Vital signs in last 24 hours: '@There'  were no vitals taken for this visit. GEN: NAD EYE: Sclerae anicteric ENT: MMM CV: Non-tachycardic Pulm: CTA b/l GI: Soft, NT/ND NEURO:  Alert & Oriented x 3   Gerrit Heck, DO [facility]   06/26/2021 8:19 AM

## 2021-06-26 NOTE — Interval H&P Note (Signed)
History and Physical Interval Note:  06/26/2021 9:07 AM  Tonya Pineda  has presented today for surgery, with the diagnosis of history of polyps.  The various methods of treatment have been discussed with the patient and family. After consideration of risks, benefits and other options for treatment, the patient has consented to  Procedure(s): COLONOSCOPY WITH PROPOFOL (N/A) as a surgical intervention.  The patient's history has been reviewed, patient examined, no change in status, stable for surgery.  I have reviewed the patient's chart and labs.  Questions were answered to the patient's satisfaction.     Dominic Pea [name]

## 2021-06-26 NOTE — Anesthesia Procedure Notes (Signed)
Date/Time: 06/26/2021 9:16 AM  Performed by: Sharlette Dense, CRNAOxygen Delivery Method: Simple face mask

## 2021-06-26 NOTE — Transfer of Care (Signed)
Immediate Anesthesia Transfer of Care Note  Patient: Tonya Pineda  Procedure(s) Performed: COLONOSCOPY WITH PROPOFOL POLYPECTOMY  Patient Location: Endoscopy Unit  Anesthesia Type:MAC  Level of Consciousness: awake and alert   Airway & Oxygen Therapy: Patient Spontanous Breathing and Patient connected to face mask oxygen  Post-op Assessment: Report given to RN and Post -op Vital signs reviewed and stable  Post vital signs: Reviewed and stable  Last Vitals:  Vitals Value Taken Time  BP    Temp    Pulse 69 06/26/21 0953  Resp 10 06/26/21 0953  SpO2 100 % 06/26/21 0953  Vitals shown include unvalidated device data.  Last Pain:  Vitals:   06/26/21 0837  TempSrc: Temporal         Complications: No notable events documented.

## 2021-06-26 NOTE — Anesthesia Postprocedure Evaluation (Signed)
Anesthesia Post Note  Patient: Tonya Pineda  Procedure(s) Performed: COLONOSCOPY WITH PROPOFOL POLYPECTOMY     Patient location during evaluation: Endoscopy Anesthesia Type: MAC Level of consciousness: awake and alert Pain management: pain level controlled Vital Signs Assessment: post-procedure vital signs reviewed and stable Respiratory status: spontaneous breathing, nonlabored ventilation, respiratory function stable and patient connected to nasal cannula oxygen Cardiovascular status: blood pressure returned to baseline and stable Postop Assessment: no apparent nausea or vomiting Anesthetic complications: no   No notable events documented.  Last Vitals:  Vitals:   06/26/21 1000 06/26/21 1010  BP: (!) 116/55 (!) 127/54  Pulse: (!) 59 (!) 56  Resp: 18 18  Temp:    SpO2: 100% 100%    Last Pain:  Vitals:   06/26/21 1010  TempSrc:   PainSc: 0-No pain                 Barnet Glasgow

## 2021-06-26 NOTE — Op Note (Signed)
[facility] Patient Name: Tonya Pineda Procedure Date: 06/26/2021 MRN: [id number] Attending MD: Gerrit Heck , MD Date of Birth: 26-Dec-1955 CSN: [id number] Age: 66 Admit Type: Outpatient Procedure:                Colonoscopy Indications:              Screening for colorectal malignant neoplasm (last                            colonoscopy was 10 years ago)                           Endoscopic History:                           -Colonoscopy (10/04/1998): Normal                           -Colonoscopy (10/28/2005): 8 mm rectal tubular                            adenoma                           -Colonoscopy (02/14/2011): 2 mm rectal polyp (path:                            Benign), Right-sided diverticulosis Providers:                Gerrit Heck, MD, Ladoris Gene, RN, William Dalton, Technician, Darliss Cheney, Technician Referring MD:              Medicines:                Monitored Anesthesia Care Complications:            No immediate complications. Estimated Blood Loss:     Estimated blood loss was minimal. Procedure:                Pre-Anesthesia Assessment:                           - Prior to the procedure, a History and Physical                            was performed, and patient medications and                            allergies were reviewed. The patient's tolerance of                            previous anesthesia was also reviewed. The risks                            and benefits of the procedure and the sedation  options and risks were discussed with the patient.                            All questions were answered, and informed consent                            was obtained. Prior Anticoagulants: The patient has                            taken no previous anticoagulant or antiplatelet                            agents. ASA Grade Assessment: II - A patient with                            mild  systemic disease. After reviewing the risks                            and benefits, the patient was deemed in                            satisfactory condition to undergo the procedure.                           After obtaining informed consent, the colonoscope                            was passed under direct vision. Throughout the                            procedure, the patient's blood pressure, pulse, and                            oxygen saturations were monitored continuously. The                            CF-HQ190L ([id number]) Olympus colonoscope was                            introduced through the anus and advanced to the the                            cecum, identified by appendiceal orifice and                            ileocecal valve. The colonoscopy was technically                            difficult and complex due to significant looping.                            Successful completion of the procedure was aided by  applying abdominal pressure. The patient tolerated                            the procedure well. The quality of the bowel                            preparation was good. The ileocecal valve,                            appendiceal orifice, and rectum were photographed. Scope In: 9:22:35 AM Scope Out: 9:47:47 AM Scope Withdrawal Time: 0 hours 15 minutes 0 seconds  Total Procedure Duration: 0 hours 25 minutes 12 seconds  Findings:      Hemorrhoids were found on perianal exam.      A 3 mm polyp was found in the rectum. The polyp was sessile. The polyp       was removed with a cold snare. Resection and retrieval were complete.       Estimated blood loss was minimal.      Multiple small and large-mouthed diverticula were found in the sigmoid       colon, descending colon, transverse colon and ascending colon.      Non-bleeding internal hemorrhoids were found during retroflexion. The       hemorrhoids were small and Grade II (internal  hemorrhoids that prolapse       but reduce spontaneously).      The sigmoid colon and ascending colon revealed significantly excessive       looping. Advancing the scope required applying abdominal pressure. Impression:               - Hemorrhoids found on perianal exam.                           - One 3 mm polyp in the rectum, removed with a cold                            snare. Resected and retrieved.                           - Diverticulosis in the sigmoid colon, in the                            descending colon, in the transverse colon and in                            the ascending colon.                           - Non-bleeding internal hemorrhoids.                           - There was significant looping of the colon. Moderate Sedation:      Not Applicable - Patient had care per Anesthesia. Recommendation:           - Patient has a contact number available for  emergencies. The signs and symptoms of potential                            delayed complications were discussed with the                            patient. Return to normal activities tomorrow.                            Written discharge instructions were provided to the                            patient.                           - Resume previous diet.                           - Continue present medications.                           - Await pathology results.                           - Repeat colonoscopy in 5-10 years for surveillance                            based on pathology results.                           - Return to GI office PRN.                           - Use fiber, for example Citrucel, Fibercon, Konsyl                            or Metamucil.                           - Internal hemorrhoids were noted on this study and                            may be amenable to hemorrhoid band ligation. If you                            are interested in further treatment of these                             hemorrhoids with band ligation, please contact my                            clinic to set up an appointment for evaluation and                            treatment. Procedure Code(s):        --- Professional ---  45385, Colonoscopy, flexible; with removal of                            tumor(s), polyp(s), or other lesion(s) by snare                            technique Diagnosis Code(s):        --- Professional ---                           Z12.11, Encounter for screening for malignant                            neoplasm of colon                           K64.1, Second degree hemorrhoids                           K62.1, Rectal polyp                           K57.30, Diverticulosis of large intestine without                            perforation or abscess without bleeding CPT copyright 2019 [employer]. All rights reserved. The codes documented in this report are preliminary and upon coder review may  be revised to meet current compliance requirements. Gerrit Heck, MD 06/26/2021 9:58:10 AM Number of Addenda: 0

## 2021-06-27 LAB — SURGICAL PATHOLOGY

## 2021-06-30 ENCOUNTER — Encounter (HOSPITAL_COMMUNITY): Payer: Self-pay | Admitting: Gastroenterology
